# Patient Record
Sex: Male | Born: 1990 | Race: Black or African American | Hispanic: No | Marital: Single | Smoking: Current every day smoker
Health system: Southern US, Community
[De-identification: ages and names within clinical notes are randomized; demographics above are authoritative.]

## PROBLEM LIST (undated history)

## (undated) DIAGNOSIS — B20 Human immunodeficiency virus [HIV] disease: Secondary | ICD-10-CM

---

## 2011-10-17 ENCOUNTER — Encounter (HOSPITAL_COMMUNITY): Payer: Self-pay

## 2011-10-17 ENCOUNTER — Emergency Department (INDEPENDENT_AMBULATORY_CARE_PROVIDER_SITE_OTHER)
Admission: EM | Admit: 2011-10-17 | Discharge: 2011-10-17 | Disposition: A | Discharge status: Home / Self Care | Payer: Managed Care, Other (non HMO) | Source: Home / Self Care | Attending: Family Medicine | Admitting: Family Medicine

## 2011-10-17 DIAGNOSIS — K602 Anal fissure, unspecified: Secondary | ICD-10-CM

## 2011-10-17 DIAGNOSIS — J069 Acute upper respiratory infection, unspecified: Secondary | ICD-10-CM

## 2011-10-17 MED ORDER — HYDROCORTISONE 2.5 % RE CREA: TOPICAL_CREAM | RECTAL | Status: AC

## 2011-10-17 MED ORDER — GUAIFENESIN-CODEINE 100-10 MG/5ML PO SYRP: ORAL_SOLUTION | ORAL | Status: DC

## 2011-10-17 MED ORDER — AMOXICILLIN 500 MG PO CAPS: 500.0000 mg | ORAL_CAPSULE | Freq: Three times a day (TID) | ORAL | Status: AC

## 2011-10-17 NOTE — ED Notes (Signed)
Pt c/o rectal pain with blood on tissue following BMs, two occurrences.  Pt states he has felt feverish.  Not taking any OTC meds.

## 2011-10-17 NOTE — Discharge Instructions (Signed)
Tylenol or motrin as needed. Avoid caffeine and milk products. Anal Fissure, Adult An anal fissure is a small tear or crack in the skin around the anus. Bleeding from a fissure usually stops on its own within a few minutes. However, bleeding will often reoccur with each bowel movement until the crack heals.  CAUSES   Passing large, hard stools.   Frequent diarrheal stools.   Constipation.   Inflammatory bowel disease (Crohn's disease or ulcerative colitis).   Infections.   Anal sex.  SYMPTOMS   Small amounts of blood seen on your stools, on toilet paper, or in the toilet after a bowel movement.   Rectal bleeding.   Painful bowel movements.   Itching or irritation around the anus.  DIAGNOSIS Your caregiver will examine the anal area. An anal fissure can usually be seen with careful inspection. A rectal exam may be performed and a short tube (anoscope) may be used to examine the anal canal. TREATMENT   You may be instructed to take fiber supplements. These supplements can soften your stool to help make bowel movements easier.   Sitz baths may be recommended to help heal the tear. Do not use soap in the sitz baths.   A medicated cream or ointment may be prescribed to lessen discomfort.  HOME CARE INSTRUCTIONS   Maintain a diet high in fruits, whole grains, and vegetables. Avoid constipating foods like bananas and dairy products.   Take sitz baths as directed by your caregiver.   Drink enough fluids to keep your urine clear or pale yellow.   Only take over-the-counter or prescription medicines for pain, discomfort, or fever as directed by your caregiver. Do not take aspirin as this may increase bleeding.   Do not use ointments containing numbing medications (anesthetics) or hydrocortisone. They could slow healing.  SEEK MEDICAL CARE IF:   Your fissure is not completely healed within 3 days.   You have further bleeding.   You have a fever.   You have diarrhea mixed  with blood.   You have pain.   Your problem is getting worse rather than better.  MAKE SURE YOU:   Understand these instructions.   Will watch your condition.   Will get help right away if you are not doing well or get worse.  Document Released: 07/01/2005 Document Revised: 06/20/2011 Document Reviewed: 12/16/2010 [facility] Patient Information 2012 [place], [place].Anal Fissure, Adult An anal fissure is a small tear or crack in the skin around the opening of the butt (anus).Bleeding from the tear or crack usually stops on its own within a few minutes. The bleeding may happen every time you poop until the tear or crack heals. HOME CARE  Eat lots of fruit, whole grains, and vegetables. Avoid foods like bananas and dairy products. These foods can make it hard to poop.   Take a warm water bath (sitz bath) as told by your doctor.   Drink enough fluids to keep your pee (urine) clear or pale yellow.   Only take medicines as told by your doctor. Do not take aspirin.   Do not use numbing creams or hydrocortisone cream on the area. These creams can slow healing.  GET HELP RIGHT AWAY IF:  Your tear or crack is not healed in 3 days.   You have more bleeding.   You have a fever.   You have watery poop (diarrhea) mixed with blood.   You have pain.   You are getting worse, not better.  MAKE SURE  YOU:   Understand these instructions.   Will watch your condition.   Will get help right away if you are not doing well or get worse.  Document Released: 02/27/2011 Document Revised: 06/20/2011 Document Reviewed: 02/27/2011 [facility] Patient Information 2012 [place], [place].Anal Fissure, Adult An anal fissure is a small tear or crack in the skin around the anus. Bleeding from a fissure usually stops on its own within a few minutes. However, bleeding will often reoccur with each bowel movement until the crack heals.  CAUSES   Passing large, hard stools.   Frequent diarrheal stools.    Constipation.   Inflammatory bowel disease (Crohn's disease or ulcerative colitis).   Infections.   Anal sex.  SYMPTOMS   Small amounts of blood seen on your stools, on toilet paper, or in the toilet after a bowel movement.   Rectal bleeding.   Painful bowel movements.   Itching or irritation around the anus.  DIAGNOSIS Your caregiver will examine the anal area. An anal fissure can usually be seen with careful inspection. A rectal exam may be performed and a short tube (anoscope) may be used to examine the anal canal. TREATMENT   You may be instructed to take fiber supplements. These supplements can soften your stool to help make bowel movements easier.   Sitz baths may be recommended to help heal the tear. Do not use soap in the sitz baths.   A medicated cream or ointment may be prescribed to lessen discomfort.  HOME CARE INSTRUCTIONS   Maintain a diet high in fruits, whole grains, and vegetables. Avoid constipating foods like bananas and dairy products.   Take sitz baths as directed by your caregiver.   Drink enough fluids to keep your urine clear or pale yellow.   Only take over-the-counter or prescription medicines for pain, discomfort, or fever as directed by your caregiver. Do not take aspirin as this may increase bleeding.   Do not use ointments containing numbing medications (anesthetics) or hydrocortisone. They could slow healing.  SEEK MEDICAL CARE IF:   Your fissure is not completely healed within 3 days.   You have further bleeding.   You have a fever.   You have diarrhea mixed with blood.   You have pain.   Your problem is getting worse rather than better.  MAKE SURE YOU:   Understand these instructions.   Will watch your condition.   Will get help right away if you are not doing well or get worse.  Document Released: 07/01/2005 Document Revised: 06/20/2011 Document Reviewed: 12/16/2010 [facility] Patient Information 2012 [place], [place].Anal  Fissure, Adult An anal fissure is a small tear or crack in the skin around the anus. Bleeding from a fissure usually stops on its own within a few minutes. However, bleeding will often reoccur with each bowel movement until the crack heals.  CAUSES   Passing large, hard stools.   Frequent diarrheal stools.   Constipation.   Inflammatory bowel disease (Crohn's disease or ulcerative colitis).   Infections.   Anal sex.  SYMPTOMS   Small amounts of blood seen on your stools, on toilet paper, or in the toilet after a bowel movement.   Rectal bleeding.   Painful bowel movements.   Itching or irritation around the anus.  DIAGNOSIS Your caregiver will examine the anal area. An anal fissure can usually be seen with careful inspection. A rectal exam may be performed and a short tube (anoscope) may be used to examine the anal canal. TREATMENT  You may be instructed to take fiber supplements. These supplements can soften your stool to help make bowel movements easier.   Sitz baths may be recommended to help heal the tear. Do not use soap in the sitz baths.   A medicated cream or ointment may be prescribed to lessen discomfort.  HOME CARE INSTRUCTIONS   Maintain a diet high in fruits, whole grains, and vegetables. Avoid constipating foods like bananas and dairy products.   Take sitz baths as directed by your caregiver.   Drink enough fluids to keep your urine clear or pale yellow.   Only take over-the-counter or prescription medicines for pain, discomfort, or fever as directed by your caregiver. Do not take aspirin as this may increase bleeding.   Do not use ointments containing numbing medications (anesthetics) or hydrocortisone. They could slow healing.  SEEK MEDICAL CARE IF:   Your fissure is not completely healed within 3 days.   You have further bleeding.   You have a fever.   You have diarrhea mixed with blood.   You have pain.   Your problem is getting worse rather  than better.  MAKE SURE YOU:   Understand these instructions.   Will watch your condition.   Will get help right away if you are not doing well or get worse.  Document Released: 07/01/2005 Document Revised: 06/20/2011 Document Reviewed: 12/16/2010 [facility] Patient Information 2012 [place], [place].

## 2011-10-17 NOTE — ED Provider Notes (Signed)
History     CSN: [id number]  Arrival date & time 10/17/11  1133   First MD Initiated Contact with Patient 10/17/11 1227      Chief Complaint  Patient presents with  . Rectal Pain  . Fever    (Consider location/radiation/quality/duration/timing/severity/associated sxs/prior treatment) HPI Comments: The patient reports having 2 complaints. One is cough and cold symptoms that started 2 wks ago ,resolved and now back. No fever. Some runny nose. No sore throat. Taking otc meds without relief. He also has noted diarrhea stools and bright red blood on tissue. Notes some anal irritation with showering. No hx of hemorrhoids.   The history is provided by the patient.    Past Medical History  Diagnosis Date  . Asthma     History reviewed. No pertinent past surgical history.  No family history on file.  History  Substance Use Topics  . Smoking status: Former Games developer  . Smokeless tobacco: Not on file  . Alcohol Use: Yes      Review of Systems  Constitutional: Positive for chills and fatigue. Negative for fever.  HENT: Positive for ear pain, congestion, rhinorrhea and postnasal drip. Negative for sore throat.   Eyes: Negative.   Respiratory: Positive for cough. Negative for shortness of breath and wheezing.   Cardiovascular: Negative.   Gastrointestinal: Positive for diarrhea. Negative for nausea and vomiting.  Genitourinary: Negative.   Musculoskeletal: Negative.   Skin: Negative.     Allergies  Review of patient's allergies indicates no known allergies.  Home Medications   Current Outpatient Rx  Name Route Sig Dispense Refill  . AMOXICILLIN 500 MG PO CAPS Oral Take 1 capsule (500 mg total) by mouth 3 (three) times daily. 30 capsule 0  . GUAIFENESIN-CODEINE 100-10 MG/5ML PO SYRP  1-2 tsp po q 6 hrs prn cough and congestion 120 mL 0  . HYDROCORTISONE 2.5 % RE CREA  Apply rectally 2 times daily 30 g 0    BP 103/66  Pulse 70  Temp(Src) 99.5 F (37.5 C) (Oral)  Resp 12   SpO2 100%  Physical Exam  Nursing note and vitals reviewed. Constitutional: He appears well-developed and well-nourished. No distress.  HENT:  Head: Normocephalic.       Nasal congestion, ears clear, throat mild erythema  Neck: Normal range of motion. Neck supple. No thyromegaly present.  Cardiovascular: Normal rate and regular rhythm.   Pulmonary/Chest: Effort normal and breath sounds normal.  Abdominal: Soft. Bowel sounds are normal. There is no tenderness.  Genitourinary:       Small anal fissure noted. No active bleeding.   Lymphadenopathy:    He has no cervical adenopathy.  Skin: Skin is warm and dry.    ED Course  Procedures (including critical care time)  Labs Reviewed - No data to display No results found.   1. URI (upper respiratory infection)   2. Anal fissure       MDM          Randa Spike, MD 10/17/11 1350

## 2012-07-15 DIAGNOSIS — B20 Human immunodeficiency virus [HIV] disease: Secondary | ICD-10-CM

## 2012-07-15 HISTORY — DX: Human immunodeficiency virus (HIV) disease: B20

## 2016-10-22 ENCOUNTER — Encounter (HOSPITAL_COMMUNITY): Payer: Self-pay

## 2016-10-22 ENCOUNTER — Emergency Department (HOSPITAL_COMMUNITY)
Admission: EM | Admit: 2016-10-22 | Discharge: 2016-10-23 | Disposition: A | Discharge status: Home / Self Care | Payer: Self-pay | Attending: Emergency Medicine | Admitting: Emergency Medicine

## 2016-10-22 DIAGNOSIS — F17200 Nicotine dependence, unspecified, uncomplicated: Secondary | ICD-10-CM | POA: Insufficient documentation

## 2016-10-22 DIAGNOSIS — J45909 Unspecified asthma, uncomplicated: Secondary | ICD-10-CM | POA: Insufficient documentation

## 2016-10-22 DIAGNOSIS — R1084 Generalized abdominal pain: Secondary | ICD-10-CM | POA: Insufficient documentation

## 2016-10-22 HISTORY — DX: Human immunodeficiency virus (HIV) disease: B20

## 2016-10-22 LAB — COMPREHENSIVE METABOLIC PANEL
ALBUMIN: 4.6 g/dL (ref 3.5–5.0)
ALK PHOS: 61 U/L (ref 38–126)
ALT: 28 U/L (ref 17–63)
AST: 47 U/L — AB (ref 15–41)
Anion gap: 10 (ref 5–15)
BILIRUBIN TOTAL: 0.9 mg/dL (ref 0.3–1.2)
BUN: 14 mg/dL (ref 6–20)
CALCIUM: 9.5 mg/dL (ref 8.9–10.3)
CO2: 22 mmol/L (ref 22–32)
CREATININE: 1.22 mg/dL (ref 0.61–1.24)
Chloride: 103 mmol/L (ref 101–111)
GFR calc Af Amer: >60 mL/min (ref >60)
GLUCOSE: 97 mg/dL (ref 65–99)
POTASSIUM: 3.8 mmol/L (ref 3.5–5.1)
Sodium: 135 mmol/L (ref 135–145)
TOTAL PROTEIN: 7.7 g/dL (ref 6.5–8.1)

## 2016-10-22 LAB — URINALYSIS, ROUTINE W REFLEX MICROSCOPIC
Bilirubin Urine: NEGATIVE
Glucose, UA: NEGATIVE mg/dL
Hgb urine dipstick: NEGATIVE
KETONES UR: 20 mg/dL — AB
LEUKOCYTES UA: NEGATIVE
NITRITE: NEGATIVE
PH: 7 (ref 5.0–8.0)
PROTEIN: NEGATIVE mg/dL
Specific Gravity, Urine: 1.025 (ref 1.005–1.030)

## 2016-10-22 LAB — CBC
HEMATOCRIT: 44.6 % (ref 39.0–52.0)
Hemoglobin: 15.5 g/dL (ref 13.0–17.0)
MCH: 30.5 pg (ref 26.0–34.0)
MCHC: 34.8 g/dL (ref 30.0–36.0)
MCV: 87.8 fL (ref 78.0–100.0)
PLATELETS: 184 10*3/uL (ref 150–400)
RBC: 5.08 MIL/uL (ref 4.22–5.81)
RDW: 12.9 % (ref 11.5–15.5)
WBC: 6.2 10*3/uL (ref 4.0–10.5)

## 2016-10-22 LAB — LIPASE, BLOOD: Lipase: 17 U/L (ref 11–51)

## 2016-10-22 NOTE — ED Triage Notes (Signed)
Pt states he was dx with bacterial infection on past Friday at [facility]; pt states he received rx for dx but he could not afford Rx; pt states he continues to have severe abdominal cramping and diarrhea and knows he needs the antibiotics; Pt c/o pain at 10/10 on arrival. Pt a&ox 4 on arrival; pt states his of HIV positive since 2014

## 2016-10-23 ENCOUNTER — Emergency Department (HOSPITAL_COMMUNITY): Payer: Self-pay

## 2016-10-23 MED ORDER — DICYCLOMINE HCL 20 MG PO TABS: 20.0000 mg | ORAL_TABLET | Freq: Two times a day (BID) | ORAL | 0 refills | Status: DC

## 2016-10-23 MED ORDER — IOPAMIDOL (ISOVUE-300) INJECTION 61%
INTRAVENOUS | Status: AC
  Administered 2016-10-23: 100 mL
  Filled 2016-10-23: qty 100

## 2016-10-23 NOTE — Discharge Instructions (Signed)
FOLLOW UP WITH YOUR DOCTOR AS NEEDED. RETURN TO THE EMERGENCY DEPARTMENT WITH ANY WORSENING PAIN OR NEW SYMPTOMS OF CONCERN. FILL AND TAKE YOUR PRESCRIPTION WRITTEN BY YOUR DOCTOR AS SOON AS POSSIBLE.

## 2016-10-23 NOTE — ED Provider Notes (Signed)
[facility]-EMERGENCY DEPT Provider Note   CSN: [id number] Arrival date & time: 10/22/16  2016     History   Chief Complaint Chief Complaint  Patient presents with  . Abdominal Pain    HPI Adam Brock is a 26 y.o. male.  Patient with history of HIV, asthma presents with complaint of abdominal pain. He reports he is followed by [facility] and was seen for this one month ago when he was diagnosed with Giardia. He was given a 7-day prescription of an antibiotic that did not resolve his symptoms. He was seen again 5 days ago and given a 21-day prescription of the same antibiotic which he has been unable to fill. He states he will be able to fill it later today. No fever. He has diarrhea without bloody stools and without change since symptoms began. He is able to eat and drink. No nausea or vomiting. He reports his cramping continues and he became concerned.   The history is provided by the patient. No language interpreter was used.  Abdominal Pain   Associated symptoms include diarrhea. Pertinent negatives include fever, nausea, vomiting and dysuria.    Past Medical History:  Diagnosis Date  . Asthma   . HIV (human immunodeficiency virus infection) (HCC) 2014    There are no active problems to display for this patient.   History reviewed. No pertinent surgical history.     Home Medications    Prior to Admission medications   Medication Sig Start Date End Date Taking? Authorizing Provider  elvitegravir-cobicistat-emtricitabine-tenofovir (STRIBILD) 150-150-200-300 MG TABS tablet Take 1 tablet by mouth daily with breakfast.   Yes Historical Provider, MD  dicyclomine (BENTYL) 20 MG tablet Take 1 tablet (20 mg total) by mouth 2 (two) times daily. 10/23/16   Elpidio Anis, PA-C  guaiFENesin-codeine [address] [employer]) 100-10 MG/5ML syrup 1-2 tsp po q 6 hrs prn cough and congestion Patient not taking: Reported on 10/23/2016 10/17/11   Randa Spike, DO    Family  History No family history on file.  Social History Social History  Substance Use Topics  . Smoking status: Current Every Day Smoker  . Smokeless tobacco: Not on file  . Alcohol use Yes     Allergies   Penicillins   Review of Systems Review of Systems  Constitutional: Negative for appetite change, chills, diaphoresis and fever.  Respiratory: Negative.  Negative for shortness of breath.   Cardiovascular: Negative.  Negative for chest pain.  Gastrointestinal: Positive for abdominal pain and diarrhea. Negative for abdominal distention, blood in stool, nausea and vomiting.  Genitourinary: Negative.  Negative for dysuria.  Musculoskeletal: Negative.   Neurological: Negative.  Negative for weakness and light-headedness.     Physical Exam Updated Vital Signs BP 118/73   Pulse 66   Temp 98.7 F (37.1 C) (Oral)   Resp 20   Ht  (1.803 m)   Wt 64.6 kg   SpO2 99%   BMI 19.87 kg/m   Physical Exam  Constitutional: He is oriented to person, place, and time. He appears well-developed and well-nourished.  HENT:  Head: Normocephalic.  Neck: Normal range of motion. Neck supple.  Cardiovascular: Normal rate and regular rhythm.   Pulmonary/Chest: Effort normal and breath sounds normal.  Abdominal: Soft. Bowel sounds are normal. He exhibits no distension. There is tenderness (Mild lower abdominal tenderness. ). There is no rebound and no guarding.  Musculoskeletal: Normal range of motion.  Neurological: He is alert and oriented to person, place, and time.  Skin: Skin is warm and dry. No rash noted.  Psychiatric: He has a normal mood and affect.     ED Treatments / Results  Labs (all labs ordered are listed, but only abnormal results are displayed) Labs Reviewed  COMPREHENSIVE METABOLIC PANEL - Abnormal; Notable for the following:       Result Value   AST 47 (*)    All other components within normal limits  URINALYSIS, ROUTINE W REFLEX MICROSCOPIC - Abnormal; Notable for  the following:    Ketones, ur 20 (*)    All other components within normal limits  LIPASE, BLOOD  CBC   Results for orders placed or performed during the hospital encounter of 10/22/16  Lipase, blood  Result Value Ref Range   Lipase 17 11 - 51 U/L  Comprehensive metabolic panel  Result Value Ref Range   Sodium 135 135 - 145 mmol/L   Potassium 3.8 3.5 - 5.1 mmol/L   Chloride 103 101 - 111 mmol/L   CO2 22 22 - 32 mmol/L   Glucose, Bld 97 65 - 99 mg/dL   BUN 14 6 - 20 mg/dL   Creatinine, Ser 5.78 0.61 - 1.24 mg/dL   Calcium 9.5 8.9 - 46.9 mg/dL   Total Protein 7.7 6.5 - 8.1 g/dL   Albumin 4.6 3.5 - 5.0 g/dL   AST 47 (H) 15 - 41 U/L   ALT 28 17 - 63 U/L   Alkaline Phosphatase 61 38 - 126 U/L   Total Bilirubin 0.9 0.3 - 1.2 mg/dL   GFR calc non Af Amer >60 >60 mL/min   GFR calc Af Amer >60 >60 mL/min   Anion gap 10 5 - 15  CBC  Result Value Ref Range   WBC 6.2 4.0 - 10.5 K/uL   RBC 5.08 4.22 - 5.81 MIL/uL   Hemoglobin 15.5 13.0 - 17.0 g/dL   HCT 62.9 52.8 - 41.3 %   MCV 87.8 78.0 - 100.0 fL   MCH 30.5 26.0 - 34.0 pg   MCHC 34.8 30.0 - 36.0 g/dL   RDW 24.4 01.0 - 27.2 %   Platelets 184 150 - 400 K/uL  Urinalysis, Routine w reflex microscopic  Result Value Ref Range   Color, Urine YELLOW YELLOW   APPearance CLEAR CLEAR   Specific Gravity, Urine 1.025 1.005 - 1.030   pH 7.0 5.0 - 8.0   Glucose, UA NEGATIVE NEGATIVE mg/dL   Hgb urine dipstick NEGATIVE NEGATIVE   Bilirubin Urine NEGATIVE NEGATIVE   Ketones, ur 20 (A) NEGATIVE mg/dL   Protein, ur NEGATIVE NEGATIVE mg/dL   Nitrite NEGATIVE NEGATIVE   Leukocytes, UA NEGATIVE NEGATIVE    EKG  EKG Interpretation None       Radiology Ct Abdomen Pelvis W Contrast  Result Date: 10/23/2016 CLINICAL DATA:  Acute onset of generalized abdominal pain. Initial encounter. EXAM: CT ABDOMEN AND PELVIS WITH CONTRAST TECHNIQUE: Multidetector CT imaging of the abdomen and pelvis was performed using the standard protocol following  bolus administration of intravenous contrast. CONTRAST:  ISOVUE-300 IOPAMIDOL (ISOVUE-300) INJECTION 61% COMPARISON:  None. FINDINGS: Lower chest: The visualized lung bases are grossly clear. The visualized portions of the mediastinum are unremarkable. Hepatobiliary: The liver is unremarkable in appearance. The gallbladder is unremarkable in appearance. The common bile duct remains normal in caliber. Pancreas: The pancreas is within normal limits. Spleen: A vague nonspecific 2.8 cm focus of increased enhancement is noted within the spleen. Adrenals/Urinary Tract: The adrenal glands are unremarkable in appearance. The  kidneys are within normal limits. There is no evidence of hydronephrosis. No renal or ureteral stones are identified. No perinephric stranding is seen. Stomach/Bowel: The stomach is unremarkable in appearance. The small bowel is within normal limits. The appendix is not well seen. There is no evidence for appendicitis. The colon is unremarkable in appearance. Vascular/Lymphatic: The abdominal aorta is unremarkable in appearance. The inferior vena cava is grossly unremarkable. No retroperitoneal lymphadenopathy is seen. No pelvic sidewall lymphadenopathy is identified. Reproductive: The bladder is mildly distended and grossly unremarkable. The prostate remains normal in size. Other: No additional soft tissue abnormalities are seen. Musculoskeletal: No acute osseous abnormalities are identified. The visualized musculature is unremarkable in appearance. IMPRESSION: 1. No acute abnormality seen to explain the patient's symptoms. 2. Vague nonspecific 2.8 cm focus of increased enhancement within the spleen. Would correlate for any associated symptoms, and consider contrast-enhanced MRI for further evaluation, as deemed clinically appropriate. Electronically Signed   By: Roanna Raider M.D.   On: 10/23/2016 02:18    Procedures Procedures (including critical care time)  Medications Ordered in  ED Medications  iopamidol (ISOVUE-300) 61 % injection (100 mLs  Contrast Given 10/23/16 0155)     Initial Impression / Assessment and Plan / ED Course  I have reviewed the triage vital signs and the nursing notes.  Pertinent labs & imaging results that were available during my care of the patient were reviewed by me and considered in my medical decision making (see chart for details).     Patient presents with ongoing abdominal pain x 1 month. Followed by [facility] ID, with abx Rx to be picked up tomorrow for Giardia infection. Non-bloody diarrhea. Good PO intake. Normal vitals without sign of dehydration on exam or labs. CT abd/pel without acute finding.   His pain has been absent here. He describes cramping pain that comes and goes. Will provide Bentyl. Strongly encouraged to pick up antibiotics tomorrow. Return precautions discussed.   Final Clinical Impressions(s) / ED Diagnoses   Final diagnoses:  Generalized abdominal pain    New Prescriptions New Prescriptions   DICYCLOMINE (BENTYL) 20 MG TABLET    Take 1 tablet (20 mg total) by mouth 2 (two) times daily.     Elpidio Anis, PA-C 10/23/16 1610    Shon Baton, MD 10/24/16 [phone]

## 2016-12-08 ENCOUNTER — Ambulatory Visit (HOSPITAL_COMMUNITY)
Admission: EM | Admit: 2016-12-08 | Discharge: 2016-12-08 | Disposition: A | Discharge status: Home / Self Care | Payer: Managed Care, Other (non HMO) | Attending: Family Medicine | Admitting: Family Medicine

## 2016-12-08 ENCOUNTER — Encounter (HOSPITAL_COMMUNITY): Payer: Self-pay | Admitting: Emergency Medicine

## 2016-12-08 DIAGNOSIS — S81811A Laceration without foreign body, right lower leg, initial encounter: Secondary | ICD-10-CM

## 2016-12-08 DIAGNOSIS — Z23 Encounter for immunization: Secondary | ICD-10-CM

## 2016-12-08 MED ORDER — TETANUS-DIPHTH-ACELL PERTUSSIS 5-2.5-18.5 LF-MCG/0.5 IM SUSP
INTRAMUSCULAR | Status: AC
  Filled 2016-12-08: qty 0.5

## 2016-12-08 MED ORDER — TETANUS-DIPHTH-ACELL PERTUSSIS 5-2.5-18.5 LF-MCG/0.5 IM SUSP
0.5000 mL | Freq: Once | INTRAMUSCULAR | Status: AC
  Administered 2016-12-08: 0.5 mL via INTRAMUSCULAR

## 2016-12-08 MED ORDER — TETANUS-DIPHTHERIA TOXOIDS TD 5-2 LFU IM INJ: 0.5000 mL | INJECTION | Freq: Once | INTRAMUSCULAR | Status: DC
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
## 2016-12-08 NOTE — ED Provider Notes (Addendum)
[facility]    CSN: [id number] Arrival date & time: 12/08/16  1949     History   Chief Complaint Chief Complaint  Patient presents with  . Laceration    HPI Adam Brock is a 26 y.o. male.   The patient presented to the [facility] with a complaint of a laceration to the right thigh just lateral and superior to the patella that occurred today when he was hit with a bag of garbage.      Past Medical History:  Diagnosis Date  . Asthma   . HIV (human immunodeficiency virus infection) (HCC) 2014    There are no active problems to display for this patient.   History reviewed. No pertinent surgical history.     Home Medications    Prior to Admission medications   Medication Sig Start Date End Date Taking? Authorizing Provider  elvitegravir-cobicistat-emtricitabine-tenofovir (STRIBILD) 150-150-200-300 MG TABS tablet Take 1 tablet by mouth daily with breakfast.   Yes [provider]    Family History History reviewed. No pertinent family history.  Social History Social History  Substance Use Topics  . Smoking status: Current Every Day Smoker  . Smokeless tobacco: Not on file  . Alcohol use Yes     Allergies   Penicillins   Review of Systems Review of Systems  Skin: Positive for wound.  All other systems reviewed and are negative.    Physical Exam Triage Vital Signs ED Triage Vitals  Enc Vitals Group     BP 12/08/16 2030 110/72     Pulse Rate 12/08/16 2030 (!) 104     Resp 12/08/16 2030 16     Temp 12/08/16 2030 98.6 F (37 C)     Temp Source 12/08/16 2030 Oral     SpO2 12/08/16 2030 98 %     Weight --      Height --      Head Circumference --      Peak Flow --      Pain Score 12/08/16 2029 9     Pain Loc --      Pain Edu? --      Excl. in GC? --    No data found.   Updated Vital Signs BP 110/72 (BP Location: Right Arm)   Pulse (!) 104   Temp 98.6 F (37 C) (Oral)   Resp 16   SpO2 98%      Physical Exam    Constitutional: He is oriented to person, place, and time. He appears well-developed and well-nourished.  HENT:  Right Ear: External ear normal.  Left Ear: External ear normal.  Eyes: Conjunctivae are normal. Pupils are equal, round, and reactive to light.  Neck: Normal range of motion. Neck supple.  Pulmonary/Chest: Effort normal.  Musculoskeletal: Normal range of motion.  Neurological: He is alert and oriented to person, place, and time.  Skin:  2 cm laceration just superior and lateral to the patella.  Nursing note and vitals reviewed.    UC Treatments / Results  Labs (all labs ordered are listed, but only abnormal results are displayed) Labs Reviewed - No data to display  EKG  EKG Interpretation None       Radiology No results found.  Procedures .Marland KitchenLaceration Repair Date/Time: 12/08/2016 9:25 PM Performed by: Elvina Sidle Authorized by: Elvina Sidle   Consent:    Consent obtained:  Verbal   Consent given by:  Patient   Risks discussed:  Infection and poor wound healing   Alternatives  discussed:  No treatment Anesthesia (see MAR for exact dosages):    Anesthesia method:  Local infiltration   Local anesthetic:  Lidocaine 2% w/o epi Laceration details:    Location:  Leg   Leg location:  R upper leg   Length (cm):  2   Depth (mm):  0.5 Repair type:    Repair type:  Simple Pre-procedure details:    Preparation:  Patient was prepped and draped in usual sterile fashion Exploration:    Hemostasis achieved with:  Direct pressure   Wound exploration: wound explored through full range of motion     Contaminated: no   Treatment:    Area cleansed with:  Betadine and saline   Amount of cleaning:  Standard   Irrigation solution:  Sterile water   Irrigation method:  Pressure wash   Visualized foreign bodies/material removed: no   Skin repair:    Repair method:  Sutures   Suture size:  4-0   Suture material:  Prolene   Suture technique:  Horizontal  mattress   Number of sutures:  3 Approximation:    Approximation:  Close   Vermilion border: well-aligned   Post-procedure details:    Dressing:  Antibiotic ointment and non-adherent dressing   Patient tolerance of procedure:  Tolerated well, no immediate complications   (including critical care time)  Medications Ordered in UC Medications  tetanus & diphtheria toxoids (adult) (TENIVAC) injection 0.5 mL (not administered)     Initial Impression / Assessment and Plan / UC Course  I have reviewed the triage vital signs and the nursing notes.  Pertinent labs & imaging results that were available during my care of the patient were reviewed by me and considered in my medical decision making (see chart for details).     Final Clinical Impressions(s) / UC Diagnoses   Final diagnoses:  Laceration of right lower extremity, initial encounter    New Prescriptions New Prescriptions   No medications on file  Return in 10 days for suture removal   Elvina SidleLauenstein, Jonthan Leite, MD 12/08/16 2128    Elvina SidleLauenstein, Daesia Zylka, MD 12/08/16 2130    Elvina SidleLauenstein, Kashus Karlen, MD 01/06/17 1249

## 2016-12-08 NOTE — Discharge Instructions (Signed)
Return in 10-11 days for removal of stitches  He may wash normally starting tomorrow but keep the dressing on tonight.

## 2018-06-11 IMAGING — CT CT ABD-PELV W/ CM
2 of 4 series · 16 of 46 positions shown, 18 images · IV contrast (Omni 300)
Comparison: None.

CLINICAL DATA: Acute onset of generalized abdominal pain. Initial
encounter.

EXAM:
CT ABDOMEN AND PELVIS WITH CONTRAST
TECHNIQUE: Multidetector CT imaging of the abdomen and pelvis was performed
using the standard protocol following bolus administration of
intravenous contrast.
CONTRAST:  100mL TS5SU5-2PP IOPAMIDOL (TS5SU5-2PP) INJECTION 61%

[Series 3: a/p w/ 5mm · axial · 0.70mm/px · z∈[+726,+1116]mm · 13 of 88 slices shown, 15 images]
[im 5/88  soft-tissue]
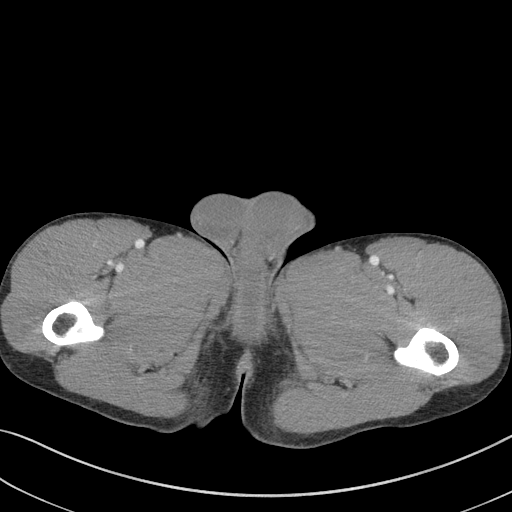
[im 5/88  bone]
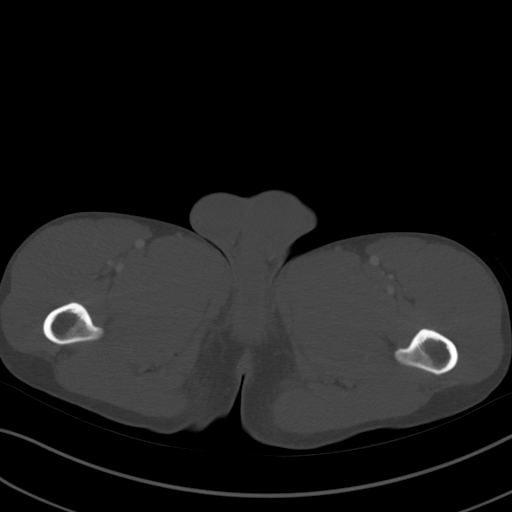
[im 14/88  soft-tissue]
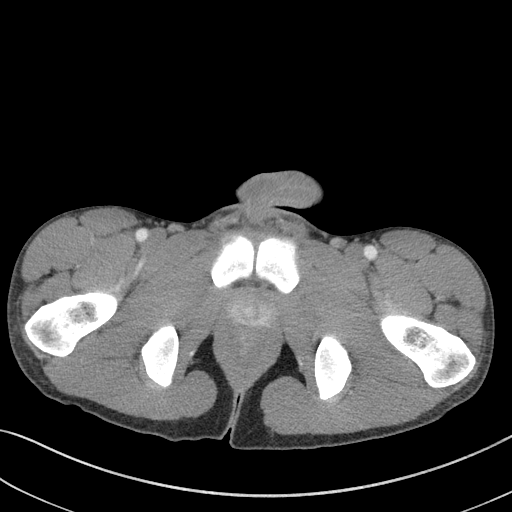
[im 18/88  soft-tissue]
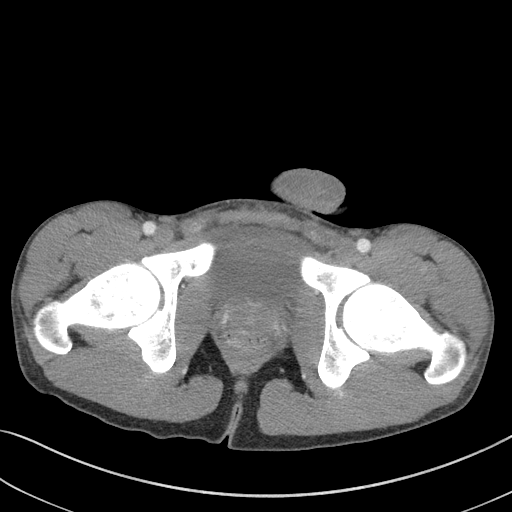
[im 27/88  soft-tissue]
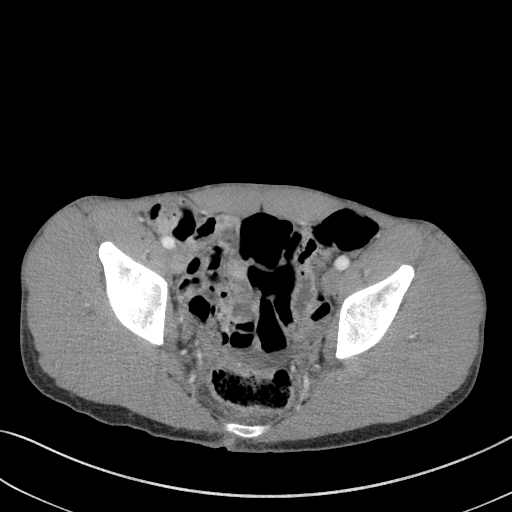
[im 31/88  soft-tissue]
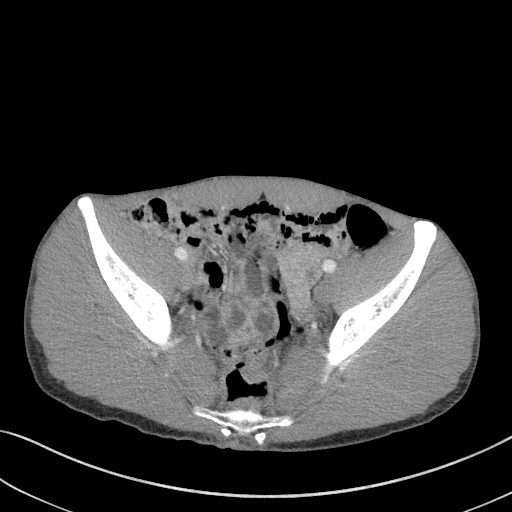
[im 40/88  soft-tissue]
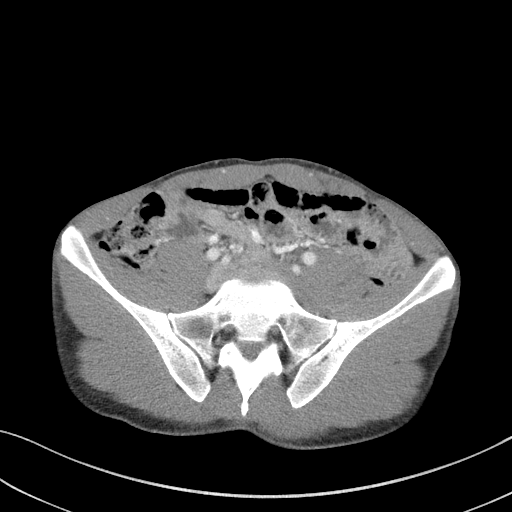
[im 44/88  soft-tissue]
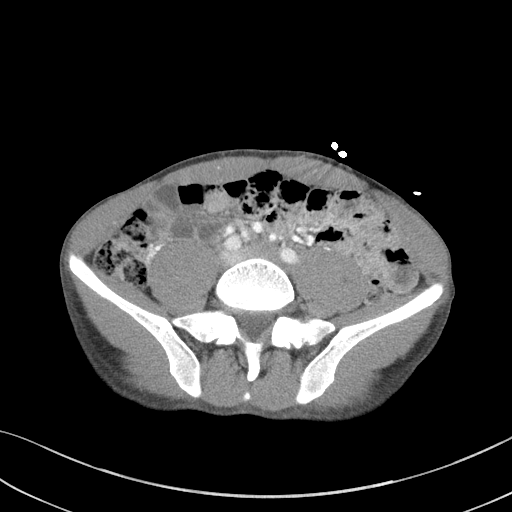
[im 48/88  soft-tissue]
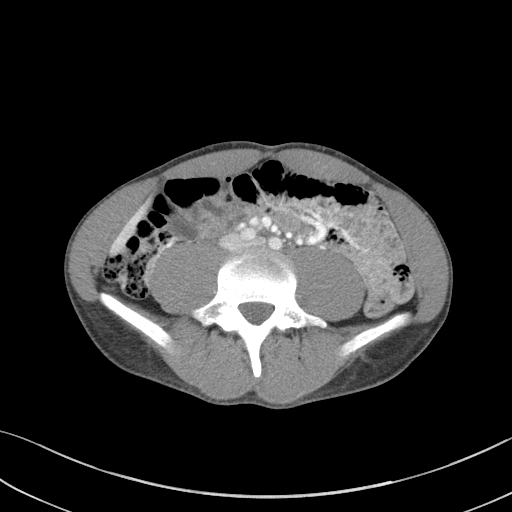
[im 57/88  soft-tissue]
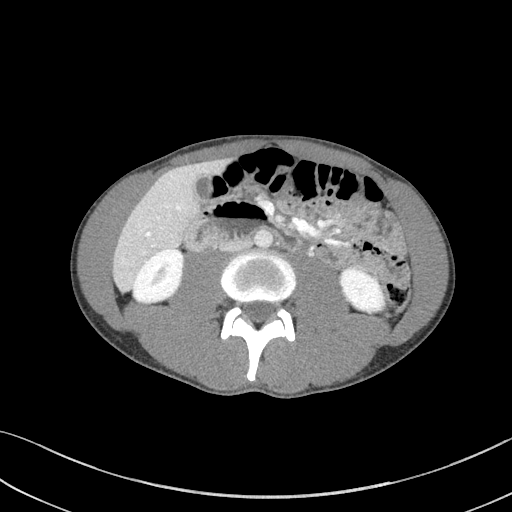
[im 57/88  bone]
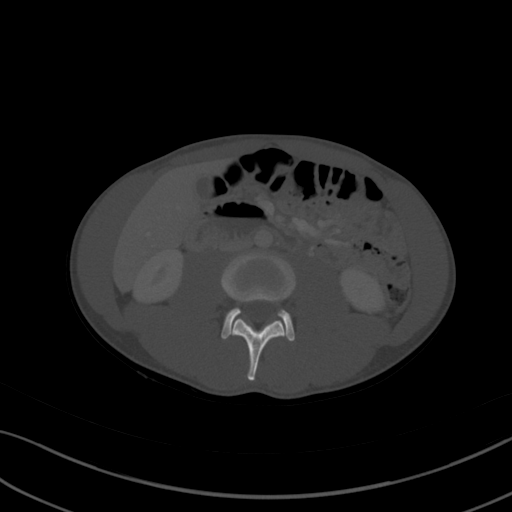
[im 61/88  soft-tissue]
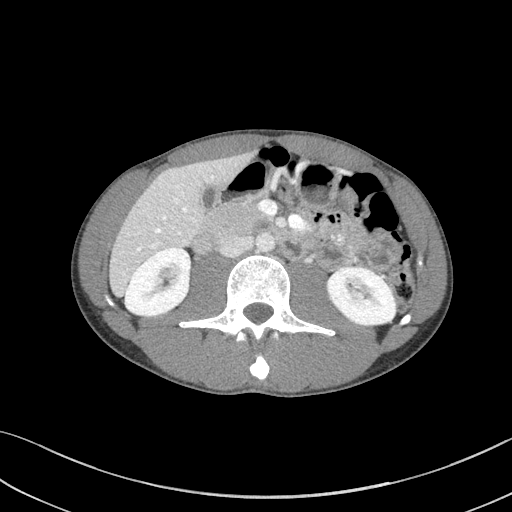
[im 70/88  soft-tissue]
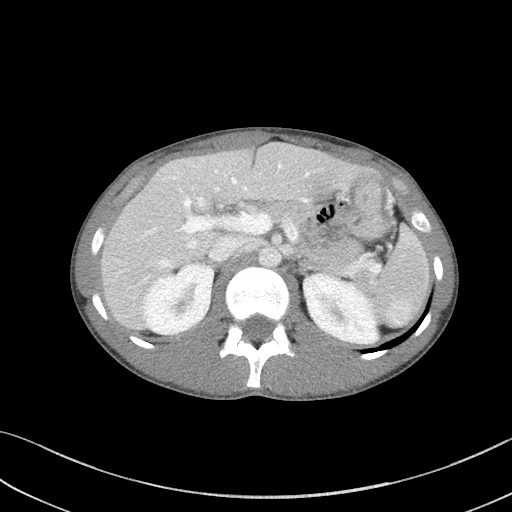
[im 74/88  soft-tissue]
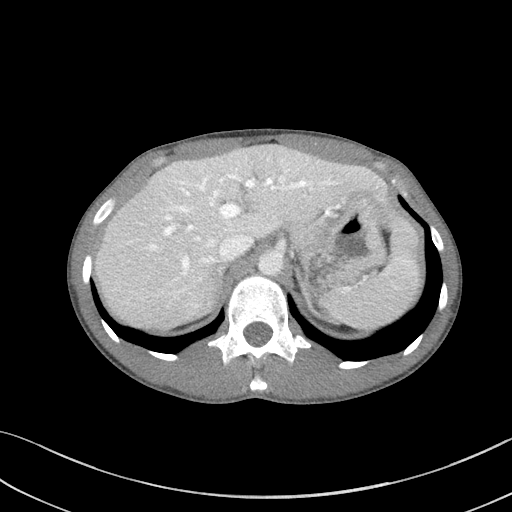
[im 83/88  soft-tissue]
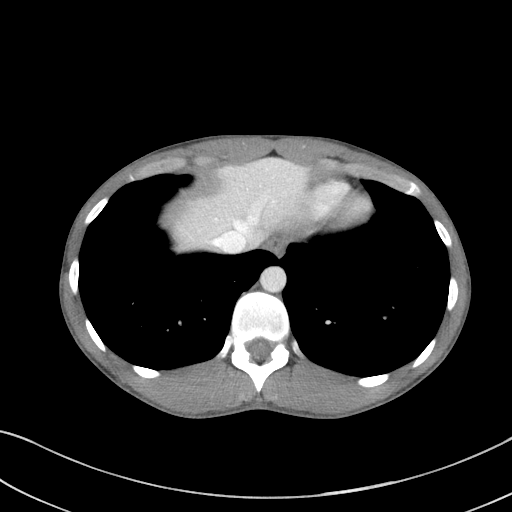

[Series 6: a/p w/ cor · coronal · 0.66mm/px · 3 of 114 slices shown]
[im 38/114  soft-tissue]
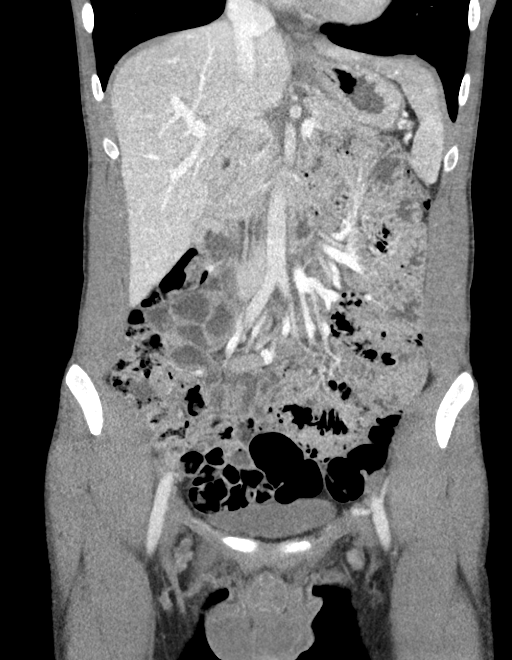
[im 51/114  soft-tissue]
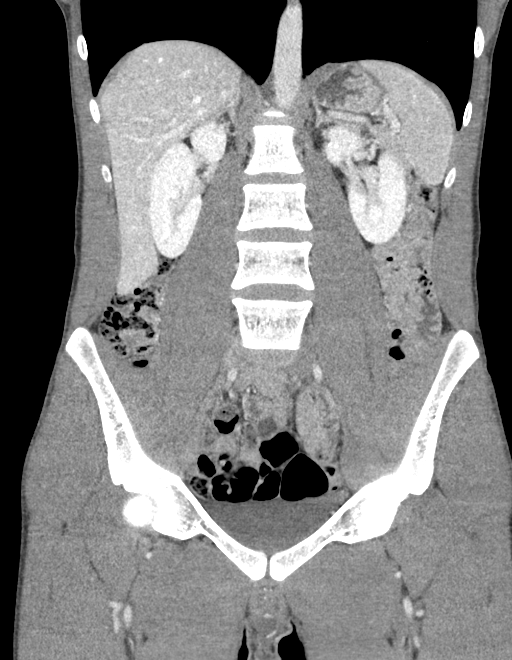
[im 63/114  soft-tissue]
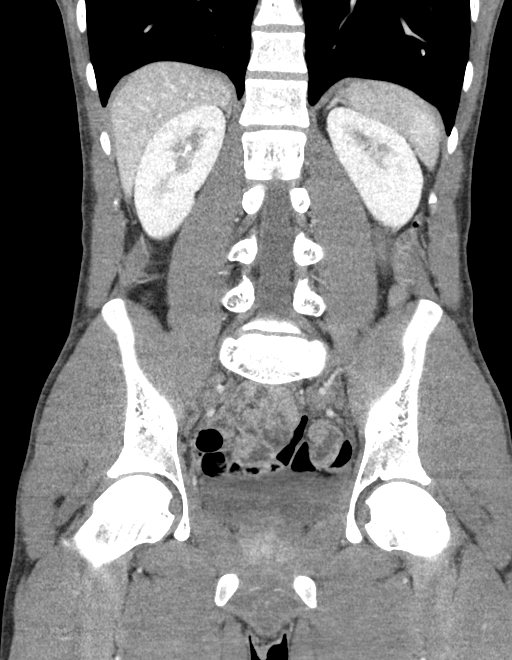

[16 of 46 positions shown; findings below may reference images not displayed]

FINDINGS: Lower chest: The visualized lung bases are grossly clear. The
visualized portions of the mediastinum are unremarkable.

Hepatobiliary: The liver is unremarkable in appearance. The
gallbladder is unremarkable in appearance. The common bile duct
remains normal in caliber.

Pancreas: The pancreas is within normal limits.

Spleen: A vague nonspecific 2.8 cm focus of increased enhancement is
noted within the spleen.

Adrenals/Urinary Tract: The adrenal glands are unremarkable in
appearance. The kidneys are within normal limits. There is no
evidence of hydronephrosis. No renal or ureteral stones are
identified. No perinephric stranding is seen.

Stomach/Bowel: The stomach is unremarkable in appearance. The small
bowel is within normal limits. The appendix is not well seen. There
is no evidence for appendicitis. The colon is unremarkable in
appearance.

Vascular/Lymphatic: The abdominal aorta is unremarkable in
appearance. The inferior vena cava is grossly unremarkable. No
retroperitoneal lymphadenopathy is seen. No pelvic sidewall
lymphadenopathy is identified.

Reproductive: The bladder is mildly distended and grossly
unremarkable. The prostate remains normal in size.

Other: No additional soft tissue abnormalities are seen.

Musculoskeletal: No acute osseous abnormalities are identified. The
visualized musculature is unremarkable in appearance.
IMPRESSION: 1. No acute abnormality seen to explain the patient's symptoms.
2. Vague nonspecific 2.8 cm focus of increased enhancement within
the spleen. Would correlate for any associated symptoms, and
consider contrast-enhanced MRI for further evaluation, as deemed
clinically appropriate.
